# Patient Record
Sex: Male | Born: 1985 | Race: Black or African American | Hispanic: No | Marital: Single | Smoking: Never smoker
Health system: Southern US, Community
[De-identification: ages and names within clinical notes are randomized; demographics above are authoritative.]

---

## 2018-07-25 ENCOUNTER — Other Ambulatory Visit: Payer: Self-pay

## 2018-07-25 ENCOUNTER — Emergency Department (HOSPITAL_COMMUNITY)
Admission: EM | Admit: 2018-07-25 | Discharge: 2018-07-25 | Disposition: A | Discharge status: Home / Self Care | Payer: Self-pay | Attending: Emergency Medicine | Admitting: Emergency Medicine

## 2018-07-25 ENCOUNTER — Emergency Department (HOSPITAL_COMMUNITY): Payer: Self-pay

## 2018-07-25 ENCOUNTER — Encounter (HOSPITAL_COMMUNITY): Payer: Self-pay | Admitting: Emergency Medicine

## 2018-07-25 DIAGNOSIS — J069 Acute upper respiratory infection, unspecified: Secondary | ICD-10-CM | POA: Insufficient documentation

## 2018-07-25 DIAGNOSIS — J988 Other specified respiratory disorders: Secondary | ICD-10-CM

## 2018-07-25 MED ORDER — DOXYCYCLINE HYCLATE 100 MG PO CAPS: 100.0000 mg | ORAL_CAPSULE | Freq: Two times a day (BID) | ORAL | 0 refills | Status: AC

## 2018-07-25 MED ORDER — BENZONATATE 100 MG PO CAPS: 100.0000 mg | ORAL_CAPSULE | Freq: Three times a day (TID) | ORAL | 0 refills | Status: AC

## 2018-07-25 MED ORDER — ALBUTEROL SULFATE HFA 108 (90 BASE) MCG/ACT IN AERS: 1.0000 | INHALATION_SPRAY | Freq: Four times a day (QID) | RESPIRATORY_TRACT | 0 refills | Status: AC | PRN

## 2018-07-25 MED ORDER — IPRATROPIUM-ALBUTEROL 0.5-2.5 (3) MG/3ML IN SOLN
3.0000 mL | Freq: Once | RESPIRATORY_TRACT | Status: AC
  Administered 2018-07-25: 3 mL via RESPIRATORY_TRACT
  Filled 2018-07-25: qty 3

## 2018-07-25 NOTE — ED Notes (Signed)
Patient verbalizes understanding of discharge instructions. Opportunity for questioning and answers were provided. Armband removed by staff, pt discharged from ED ambulatory.   

## 2018-07-25 NOTE — ED Triage Notes (Signed)
Pt with coughing and wheezing. Denies fever

## 2018-07-25 NOTE — Discharge Instructions (Addendum)
Please read attached information. If you experience any new or worsening signs or symptoms please return to the emergency room for evaluation. Please follow-up with your primary care provider or specialist as discussed. Please use medication prescribed only as directed and discontinue taking if you have any concerning signs or symptoms.   °

## 2018-07-25 NOTE — ED Provider Notes (Signed)
[facility] EMERGENCY DEPARTMENT Provider Note   CSN: [id number] Arrival date & time: 07/25/18  1041    History   Chief Complaint Chief Complaint  Patient presents with  . Cough  . Wheezing    HPI Lawrence Goodwin is a 33 y.o. male.     HPI   33 year old male presents today with complaints of cough.  Patient notes approximately 11 days ago he developed fever body aches and cough.  Patient notes this lasted for several days but then began to improve.  He reports that symptoms again then worsened, feeling bad, body aches.  Patient denies any acute fever presently.  He denies any shortness of breath.  Is a non-smoker with no pulmonary history including asthma.  He denies any other chronic health conditions.  He reports close sick contacts with similar symptoms.  He denies any recent travel.     History reviewed. No pertinent past medical history.  There are no active problems to display for this patient.   History reviewed. No pertinent surgical history.      Home Medications    Prior to Admission medications   Medication Sig Start Date End Date Taking? Authorizing Provider  albuterol (PROVENTIL HFA;VENTOLIN HFA) 108 (90 Base) MCG/ACT inhaler Inhale 1-2 puffs into the lungs every 6 (six) hours as needed for wheezing or shortness of breath. 07/25/18   Mara Favero, Tinnie Gens, PA-C  benzonatate (TESSALON) 100 MG capsule Take 1 capsule (100 mg total) by mouth every 8 (eight) hours. 07/25/18   Adreyan Carbajal, Tinnie Gens, PA-C  doxycycline (VIBRAMYCIN) 100 MG capsule Take 1 capsule (100 mg total) by mouth 2 (two) times daily. 07/25/18   Eyvonne Mechanic, PA-C    Family History No family history on file.  Social History Social History   Tobacco Use  . Smoking status: Never Smoker  . Smokeless tobacco: Never Used  Substance Use Topics  . Alcohol use: Not Currently  . Drug use: Yes    Types: Marijuana     Allergies   Patient has no known allergies.   Review of  Systems Review of Systems  All other systems reviewed and are negative.    Physical Exam Updated Vital Signs BP 113/67 (BP Location: Right Arm)   Pulse 84   Temp 98.3 F (36.8 C) (Oral)   Resp 18   SpO2 97%   Physical Exam Vitals signs and nursing note reviewed.  Constitutional:      Appearance: He is well-developed.  HENT:     Head: Normocephalic and atraumatic.     Comments: Oropharynx clear no swelling, erythema, edema Eyes:     General: No scleral icterus.       Right eye: No discharge.        Left eye: No discharge.     Conjunctiva/sclera: Conjunctivae normal.     Pupils: Pupils are equal, round, and reactive to light.  Neck:     Musculoskeletal: Normal range of motion.     Vascular: No JVD.     Trachea: No tracheal deviation.  Pulmonary:     Effort: Pulmonary effort is normal. No respiratory distress.     Breath sounds: Normal breath sounds. No stridor. No wheezing, rhonchi or rales.     Comments: Productive cough noted Neurological:     Mental Status: He is alert and oriented to person, place, and time.     Coordination: Coordination normal.  Psychiatric:        Behavior: Behavior normal.  Thought Content: Thought content normal.        Judgment: Judgment normal.      ED Treatments / Results  Labs (all labs ordered are listed, but only abnormal results are displayed) Labs Reviewed - No data to display  EKG None  Radiology Dg Chest 2 View  Result Date: 07/25/2018 CLINICAL DATA:  Productive cough for 11 days. Possible fever. Current smoker. EXAM: CHEST - 2 VIEW COMPARISON:  None. FINDINGS: The heart size and mediastinal contours are normal. The lungs are clear. There is no pleural effusion or pneumothorax. No acute osseous findings are identified. IMPRESSION: No active cardiopulmonary process. Electronically Signed   By: Carey Bullocks M.D.   On: 07/25/2018 11:58    Procedures Procedures (including critical care time)  Medications Ordered in  ED Medications  ipratropium-albuterol (DUONEB) 0.5-2.5 (3) MG/3ML nebulizer solution 3 mL (3 mLs Nebulization Given 07/25/18 1112)     Initial Impression / Assessment and Plan / ED Course  I have reviewed the triage vital signs and the nursing notes.  Pertinent labs & imaging results that were available during my care of the patient were reviewed by me and considered in my medical decision making (see chart for details).        33 year old male presents today with respiratory infection.  He had improvement in symptoms followed by secondary worsening.  Concern for bacterial etiology.  He overall looks well with a reassuring chest x-ray.  But given his symptoms and duration I do find it would be appropriate to discharge him home on antibiotics.  Patient given return precautions, he verbalized understanding and agreement to today's plan had no further questions or concerns at the time discharge.  Final Clinical Impressions(s) / ED Diagnoses   Final diagnoses:  Respiratory infection    ED Discharge Orders         Ordered    doxycycline (VIBRAMYCIN) 100 MG capsule  2 times daily     07/25/18 1231    albuterol (PROVENTIL HFA;VENTOLIN HFA) 108 (90 Base) MCG/ACT inhaler  Every 6 hours PRN     07/25/18 1231    benzonatate (TESSALON) 100 MG capsule  Every 8 hours     07/25/18 1231           Eyvonne Mechanic, PA-C 07/25/18 1522    Raeford Razor, MD 08/05/18 1258

## 2020-06-03 IMAGING — DX CHEST - 2 VIEW
2 series · 2 of 2 positions shown · non-contrast
Comparison: None.

CLINICAL DATA: Productive cough for 11 days. Possible fever.
Current smoker.

EXAM:
CHEST - 2 VIEW

[chest pa]
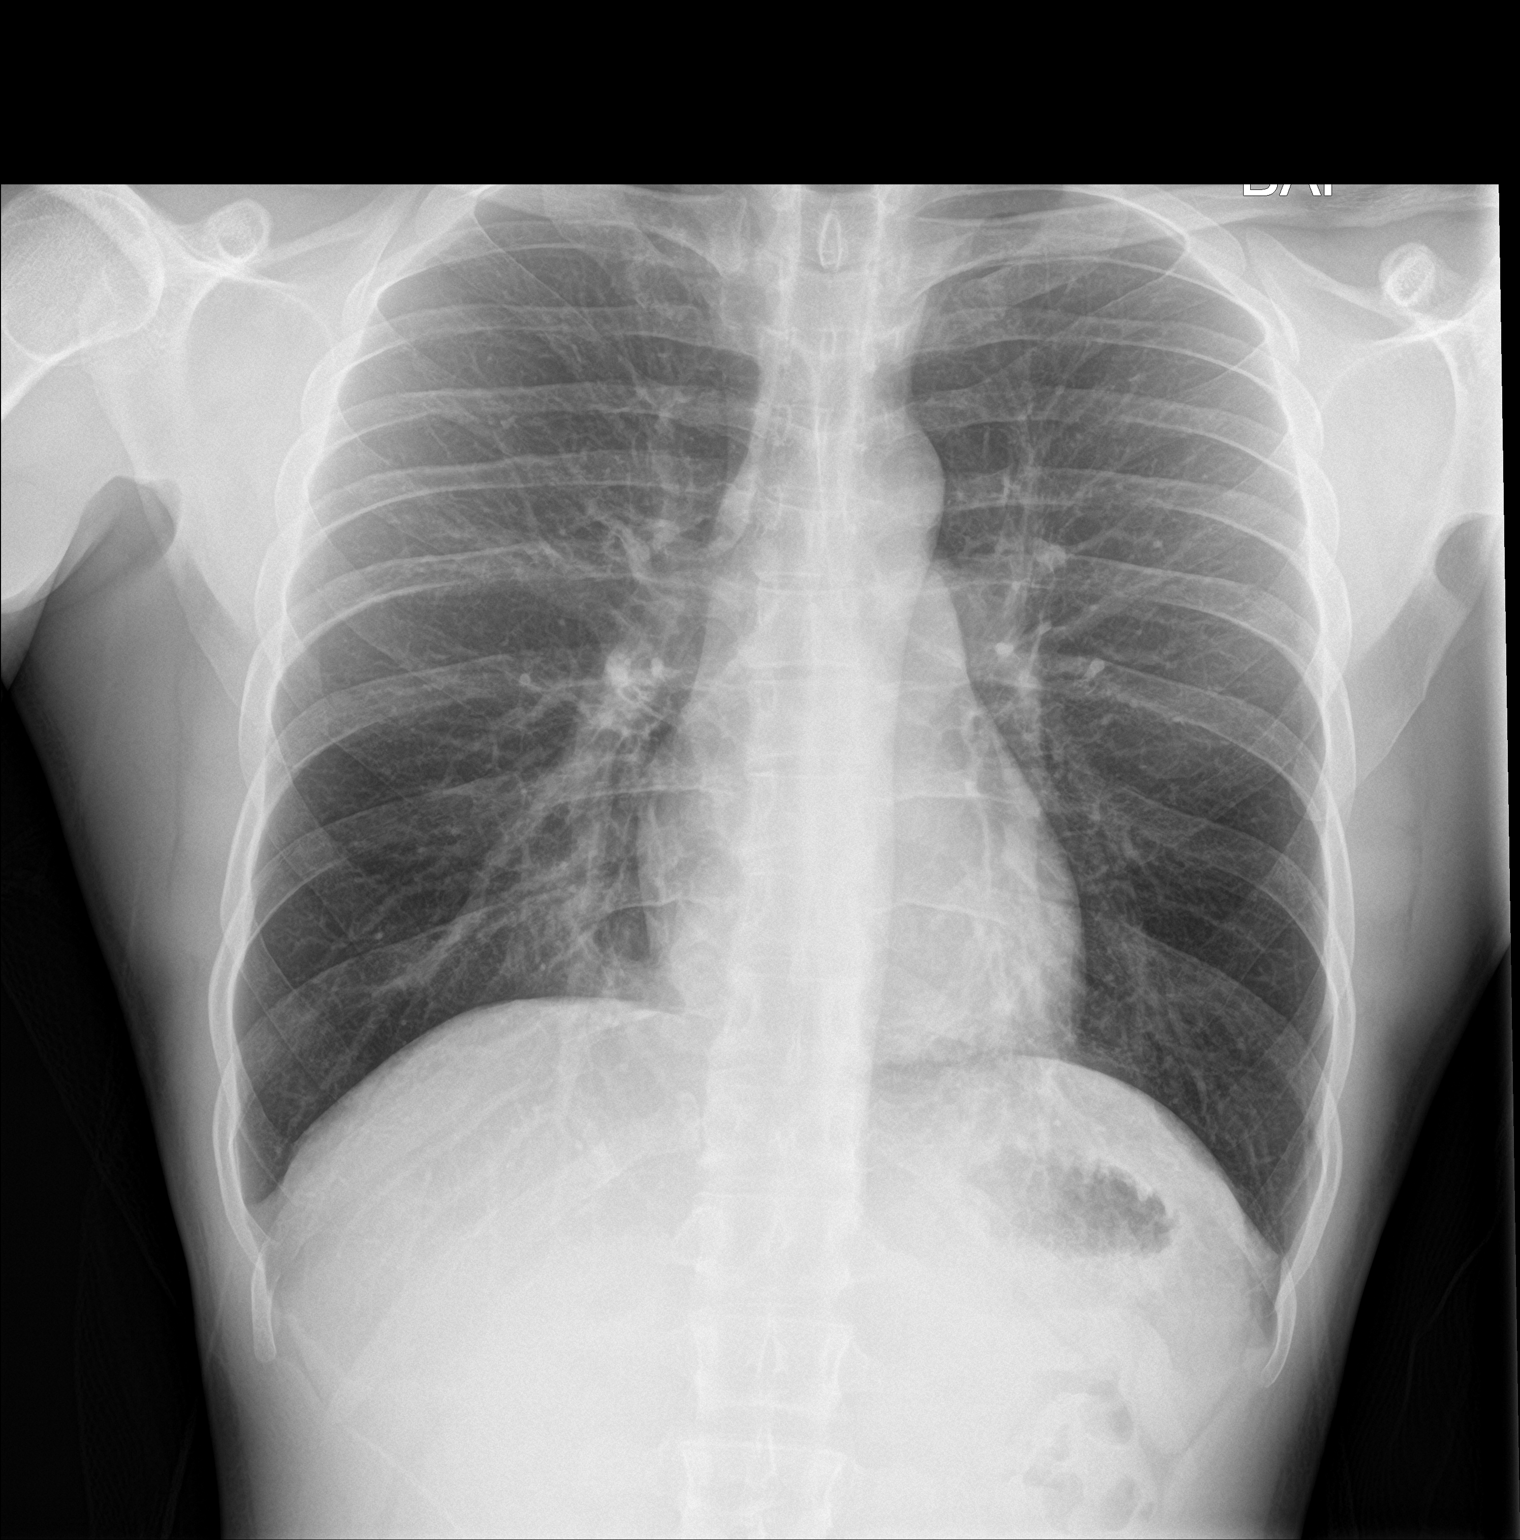

[chest lat]
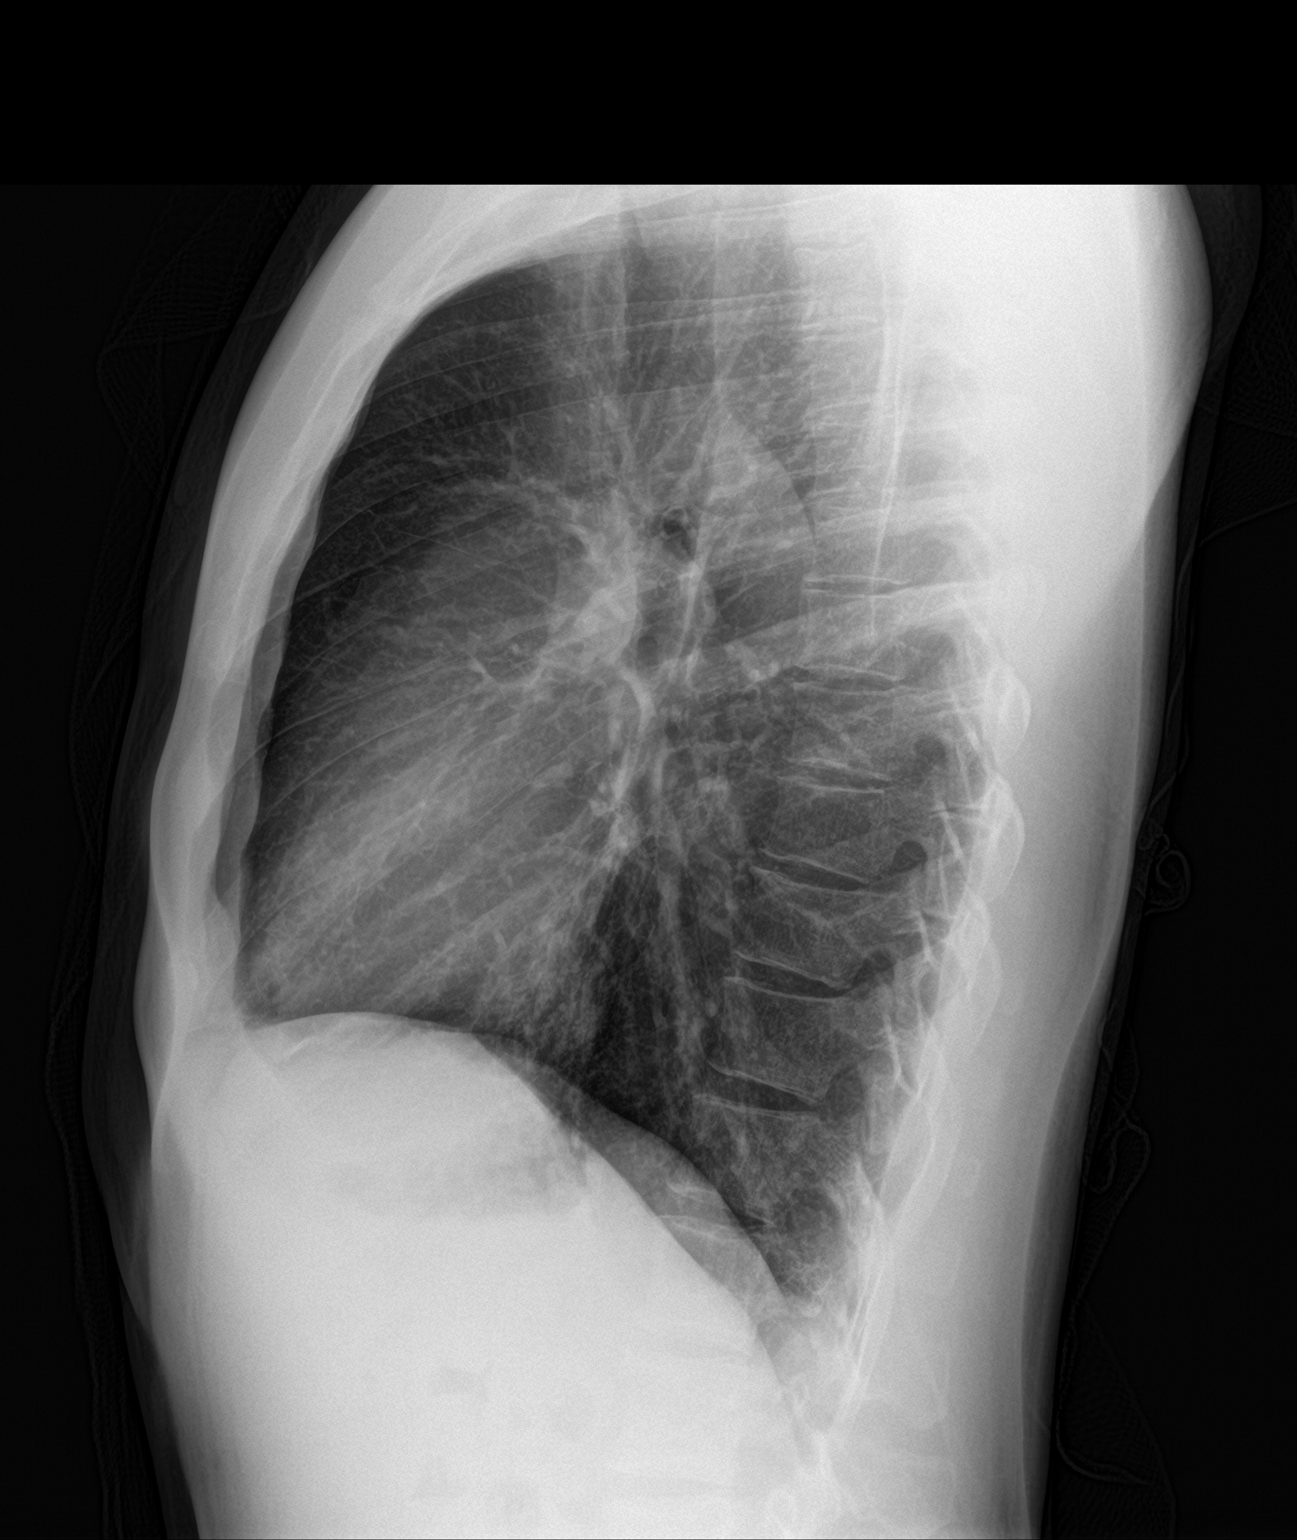

[2 of 2 positions shown; findings below may reference images not displayed]

FINDINGS: The heart size and mediastinal contours are normal. The lungs are
clear. There is no pleural effusion or pneumothorax. No acute
osseous findings are identified.
IMPRESSION: No active cardiopulmonary process.
